# Patient Record
Sex: Male | Born: 1995 | Race: White | Hispanic: No | Marital: Single | Smoking: Never smoker
Health system: Southern US, Community
[De-identification: ages and names within clinical notes are randomized; demographics above are authoritative.]

---
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
## 2018-02-21 ENCOUNTER — Encounter: Payer: Self-pay | Admitting: Medical Oncology

## 2018-02-21 ENCOUNTER — Emergency Department: Payer: Managed Care, Other (non HMO)

## 2018-02-21 ENCOUNTER — Emergency Department
Admission: EM | Admit: 2018-02-21 | Discharge: 2018-02-21 | Disposition: A | Discharge status: Home / Self Care | Payer: Managed Care, Other (non HMO) | Attending: Emergency Medicine | Admitting: Emergency Medicine

## 2018-02-21 DIAGNOSIS — N200 Calculus of kidney: Secondary | ICD-10-CM | POA: Diagnosis not present

## 2018-02-21 DIAGNOSIS — R1031 Right lower quadrant pain: Secondary | ICD-10-CM | POA: Diagnosis present

## 2018-02-21 DIAGNOSIS — R110 Nausea: Secondary | ICD-10-CM | POA: Diagnosis not present

## 2018-02-21 LAB — URINALYSIS, COMPLETE (UACMP) WITH MICROSCOPIC
BACTERIA UA: NONE SEEN
Bilirubin Urine: NEGATIVE
Glucose, UA: NEGATIVE mg/dL
Ketones, ur: NEGATIVE mg/dL
Leukocytes, UA: NEGATIVE
Nitrite: NEGATIVE
PROTEIN: 30 mg/dL — AB
Specific Gravity, Urine: 1.02 (ref 1.005–1.030)
pH: 9 — ABNORMAL HIGH (ref 5.0–8.0)

## 2018-02-21 LAB — COMPREHENSIVE METABOLIC PANEL
ALK PHOS: 81 U/L (ref 38–126)
ALT: 89 U/L — ABNORMAL HIGH (ref 0–44)
ANION GAP: 12 (ref 5–15)
AST: 51 U/L — ABNORMAL HIGH (ref 15–41)
Albumin: 4.6 g/dL (ref 3.5–5.0)
BUN: 14 mg/dL (ref 6–20)
CO2: 25 mmol/L (ref 22–32)
Calcium: 9.5 mg/dL (ref 8.9–10.3)
Chloride: 102 mmol/L (ref 98–111)
Creatinine, Ser: 0.99 mg/dL (ref 0.61–1.24)
Glucose, Bld: 105 mg/dL — ABNORMAL HIGH (ref 70–99)
Potassium: 4.1 mmol/L (ref 3.5–5.1)
SODIUM: 139 mmol/L (ref 135–145)
Total Bilirubin: 0.9 mg/dL (ref 0.3–1.2)
Total Protein: 8.1 g/dL (ref 6.5–8.1)

## 2018-02-21 LAB — CBC
HEMATOCRIT: 50.2 % (ref 39.0–52.0)
Hemoglobin: 17.1 g/dL — ABNORMAL HIGH (ref 13.0–17.0)
MCH: 28.3 pg (ref 26.0–34.0)
MCHC: 34.1 g/dL (ref 30.0–36.0)
MCV: 83.1 fL (ref 80.0–100.0)
NRBC: 0 % (ref 0.0–0.2)
Platelets: 274 10*3/uL (ref 150–400)
RBC: 6.04 MIL/uL — ABNORMAL HIGH (ref 4.22–5.81)
RDW: 13.1 % (ref 11.5–15.5)
WBC: 6.8 10*3/uL (ref 4.0–10.5)

## 2018-02-21 LAB — LIPASE, BLOOD: LIPASE: 28 U/L (ref 11–51)

## 2018-02-21 MED ORDER — OXYCODONE-ACETAMINOPHEN 5-325 MG PO TABS: 1.0000 | ORAL_TABLET | ORAL | 0 refills | Status: AC | PRN

## 2018-02-21 MED ORDER — TAMSULOSIN HCL 0.4 MG PO CAPS: 0.4000 mg | ORAL_CAPSULE | Freq: Every day | ORAL | 0 refills | Status: AC

## 2018-02-21 MED ORDER — ONDANSETRON HCL 4 MG/2ML IJ SOLN
INTRAMUSCULAR | Status: AC
  Administered 2018-02-21: 4 mg via INTRAVENOUS
  Filled 2018-02-21: qty 2

## 2018-02-21 MED ORDER — IOPAMIDOL (ISOVUE-300) INJECTION 61%
30.0000 mL | Freq: Once | INTRAVENOUS | Status: AC | PRN
  Administered 2018-02-21: 30 mL via ORAL

## 2018-02-21 MED ORDER — ONDANSETRON HCL 4 MG/2ML IJ SOLN
4.0000 mg | Freq: Once | INTRAMUSCULAR | Status: AC
  Administered 2018-02-21: 4 mg via INTRAVENOUS

## 2018-02-21 MED ORDER — KETOROLAC TROMETHAMINE 60 MG/2ML IM SOLN
INTRAMUSCULAR | Status: AC
  Filled 2018-02-21: qty 2

## 2018-02-21 MED ORDER — SODIUM CHLORIDE 0.9 % IV BOLUS
1000.0000 mL | Freq: Once | INTRAVENOUS | Status: AC
  Administered 2018-02-21: 1000 mL via INTRAVENOUS

## 2018-02-21 MED ORDER — KETOROLAC TROMETHAMINE 60 MG/2ML IM SOLN
60.0000 mg | Freq: Once | INTRAMUSCULAR | Status: AC
  Administered 2018-02-21: 60 mg via INTRAMUSCULAR

## 2018-02-21 MED ORDER — MORPHINE SULFATE (PF) 4 MG/ML IV SOLN
INTRAVENOUS | Status: AC
  Administered 2018-02-21: 4 mg via INTRAVENOUS
  Filled 2018-02-21: qty 1

## 2018-02-21 MED ORDER — MORPHINE SULFATE (PF) 4 MG/ML IV SOLN
4.0000 mg | Freq: Once | INTRAVENOUS | Status: AC
  Administered 2018-02-21: 4 mg via INTRAVENOUS

## 2018-02-21 NOTE — ED Triage Notes (Signed)
Pt reports he began having rt lower abd pain this am while attempting to get out of his car, pt reports he has been having difficulty urinating also. Denies fever.

## 2018-02-21 NOTE — Discharge Instructions (Signed)
Please take your medication as needed, as prescribed.  If you failed to pass the kidney stone within the next 1 week or you have return of pain please call the number provided for urology to arrange a follow-up appointment.  Please use your urine strainer provided, to ensure passage of the stone.  Return to the emergency department for any painful urination, fever, or any other symptom personally concerning to yourself.

## 2018-02-21 NOTE — ED Provider Notes (Signed)
[facility] Emergency Department Provider Note  Time seen: 10:01 AM  I have reviewed the triage vital signs and the nursing notes.   HISTORY  Chief Complaint Abdominal Pain    HPI Tony Coleman is a 22 y.o. male with no significant past medical history presents to the emergency department for right lower quadrant abdominal pain beginning early this morning.  States it has been severe 10/10 pain sharp in the right lower quadrant.  Denies any dysuria although states it was somewhat difficult for him to urinate this morning, but states he feels like he fully emptied his bladder this morning.  Denies hematuria.  States nausea but denies vomiting.  Denies fever.   History reviewed. No pertinent past medical history.  There are no active problems to display for this patient.   History reviewed. No pertinent surgical history.  Prior to Admission medications   Not on File    No Known Allergies  No family history on file.  Social History Social History   Tobacco Use  . Smoking status: Not on file  Substance Use Topics  . Alcohol use: Not on file  . Drug use: Not on file    Review of Systems Constitutional: Negative for fever. Cardiovascular: Negative for chest pain. Respiratory: Negative for shortness of breath. Gastrointestinal: Right lower quadrant abdominal pain.  Positive for nausea.  Negative for vomiting. Genitourinary: No dysuria.  No hematuria. Musculoskeletal: Negative for musculoskeletal complaints Skin: Negative for skin complaints  Neurological: Negative for headache All other ROS negative  ____________________________________________   PHYSICAL EXAM:  VITAL SIGNS: ED Triage Vitals  Enc Vitals Group     BP 02/21/18 0940 138/75     Pulse Rate 02/21/18 0940 (!) 102     Resp 02/21/18 0940 18     Temp 02/21/18 0940 97.7 F (36.5 C)     Temp Source 02/21/18 0940 Oral     SpO2 02/21/18 0940 98 %     Weight 02/21/18 0940 250 lb  (113.4 kg)     Height 02/21/18 0940 6' (1.829 m)     Head Circumference --      Peak Flow --      Pain Score 02/21/18 0937 9     Pain Loc --      Pain Edu? --      Excl. in GC? --    Constitutional: Alert and oriented. Well appearing and in no distress. Eyes: Normal exam ENT   Head: Normocephalic and atraumatic.   Mouth/Throat: Mucous membranes are moist. Cardiovascular: Normal rate, regular rhythm. No murmur Respiratory: Normal respiratory effort without tachypnea nor retractions. Breath sounds are clear  Gastrointestinal: Soft, mild to moderate right lower quadrant tenderness otherwise benign abdominal exam without rebound guarding or distention. Musculoskeletal: Nontender with normal range of motion in all extremities. Neurologic:  Normal speech and language. No gross focal neurologic deficits  Skin:  Skin is warm, dry and intact.  Psychiatric: Mood and affect are normal.   ____________________________________________   RADIOLOGY  CT consistent with 2 to 3 mm distal right ureteral stone in the UVJ.  ____________________________________________   INITIAL IMPRESSION / ASSESSMENT AND PLAN / ED COURSE  Pertinent labs & imaging results that were available during my care of the patient were reviewed by me and considered in my medical decision making (see chart for details).  Patient presents emergency department with significant right lower quadrant abdominal pain beginning this morning.  Differential would include ureterolithiasis, appendicitis, less likely cholecystitis colitis  or diverticulitis.  We will check urine, labs, obtain CT imaging to further evaluate.  Patient agreeable to plan of care.  Labs are resulted showing RBCs and urinalysis again likely indicating ureterolithiasis which fits the clinical picture.  CT scan shows a 2 to 3 mm UVJ stone on the right side.  Patient states his pain is completely resolved after Toradol.  We will discharge with Flomax and pain  medication to use if needed.  We will discharge with a urine strainer and urology follow-up if needed.  Patient agreeable to plan of care.  Patient states his blood pressure is a little higher than normal currently 155/81, I recommended the patient follow-up with his doctor regarding this and will get a home blood pressure cuff to measure blood pressure at home when he is not under stress or anxiety.  Patient agreeable.    ____________________________________________   FINAL CLINICAL IMPRESSION(S) / ED DIAGNOSES  Right lower quadrant abdominal pain Ureterolithiasis   Minna Antis, MD 02/21/18 1107

## 2019-05-10 ENCOUNTER — Other Ambulatory Visit: Payer: Managed Care, Other (non HMO)

## 2019-05-11 ENCOUNTER — Other Ambulatory Visit: Payer: Self-pay

## 2019-05-11 ENCOUNTER — Encounter (HOSPITAL_COMMUNITY): Payer: Self-pay | Admitting: Emergency Medicine

## 2019-05-11 ENCOUNTER — Emergency Department (HOSPITAL_COMMUNITY)
Admission: EM | Admit: 2019-05-11 | Discharge: 2019-05-11 | Disposition: A | Discharge status: Home / Self Care | Payer: 59 | Attending: Emergency Medicine | Admitting: Emergency Medicine

## 2019-05-11 ENCOUNTER — Emergency Department (HOSPITAL_COMMUNITY): Payer: 59

## 2019-05-11 DIAGNOSIS — Z79899 Other long term (current) drug therapy: Secondary | ICD-10-CM | POA: Insufficient documentation

## 2019-05-11 DIAGNOSIS — R0602 Shortness of breath: Secondary | ICD-10-CM | POA: Diagnosis not present

## 2019-05-11 DIAGNOSIS — Z20822 Contact with and (suspected) exposure to covid-19: Secondary | ICD-10-CM | POA: Insufficient documentation

## 2019-05-11 LAB — POC SARS CORONAVIRUS 2 AG -  ED: SARS Coronavirus 2 Ag: NEGATIVE

## 2019-05-11 NOTE — ED Provider Notes (Signed)
[place] EMERGENCY DEPARTMENT Provider Note   CSN: [id number] Arrival date & time: 05/11/19  0308     History No chief complaint on file.   Tony Coleman is a 24 y.o. male.  Patient presents to the emergency department with a chief complaint of shortness of breath.  He reports recent exposures to Covid-19 at work.  He is concerned because he has had pneumonia twice.  He denies any fever or chills.  He states that he has had some nausea as well as diarrhea.  Denies any other associated symptoms.  There are no modifying factors.  The history is provided by the patient. No language interpreter was used.       No past medical history on file.  There are no problems to display for this patient.   No past surgical history on file.     No family history on file.  Social History   Tobacco Use  . Smoking status: Never Smoker  Substance Use Topics  . Alcohol use: Not on file  . Drug use: Not on file    Home Medications Prior to Admission medications   Medication Sig Start Date End Date Taking? Authorizing Provider  oxyCODONE-acetaminophen (PERCOCET) 5-325 MG tablet Take 1 tablet by mouth every 4 (four) hours as needed for severe pain. 02/21/18   Harvest Dark, MD  tamsulosin (FLOMAX) 0.4 MG CAPS capsule Take 1 capsule (0.4 mg total) by mouth daily. 02/21/18   Harvest Dark, MD    Allergies    Patient has no known allergies.  Review of Systems   Review of Systems  All other systems reviewed and are negative.   Physical Exam Updated Vital Signs BP (!) 143/93 (BP Location: Left Arm)   Pulse 88   Temp 98.3 F (36.8 C) (Oral)   Resp 17   Ht 6' (1.829 m)   Wt 122.5 kg   SpO2 97%   BMI 36.62 kg/m   Physical Exam Vitals and nursing note reviewed.  Constitutional:      Appearance: He is well-developed.  HENT:     Head: Normocephalic and atraumatic.  Eyes:     Conjunctiva/sclera: Conjunctivae normal.  Cardiovascular:     Rate and  Rhythm: Normal rate and regular rhythm.     Heart sounds: No murmur.  Pulmonary:     Effort: Pulmonary effort is normal. No respiratory distress.     Breath sounds: Normal breath sounds.  Abdominal:     Palpations: Abdomen is soft.     Tenderness: There is no abdominal tenderness.  Musculoskeletal:        General: Normal range of motion.     Cervical back: Neck supple.  Skin:    General: Skin is warm and dry.  Neurological:     Mental Status: He is alert and oriented to person, place, and time.  Psychiatric:        Mood and Affect: Mood normal.        Behavior: Behavior normal.     ED Results / Procedures / Treatments   Labs (all labs ordered are listed, but only abnormal results are displayed) Labs Reviewed  NOVEL CORONAVIRUS, NAA (HOSP ORDER, SEND-OUT TO REF LAB; TAT 18-24 HRS)  POC SARS CORONAVIRUS 2 AG -  ED    EKG None  Radiology DG Chest 2 View  Result Date: 05/11/2019 CLINICAL DATA:  Shortness of breath, unable to take a deep breath EXAM: CHEST - 2 VIEW COMPARISON:  None FINDINGS: No  consolidation, features of edema, pneumothorax, or effusion. Pulmonary vascularity is normally distributed. The cardiomediastinal contours are unremarkable. No acute osseous or soft tissue abnormality. IMPRESSION: No acute cardiopulmonary abnormality. Electronically Signed   By: Kreg Shropshire M.D.   On: 05/11/2019 03:46    Procedures Procedures (including critical care time)  Medications Ordered in ED Medications - No data to display  ED Course  I have reviewed the triage vital signs and the nursing notes.  Pertinent labs & imaging results that were available during my care of the patient were reviewed by me and considered in my medical decision making (see chart for details).    MDM Rules/Calculators/A&P                      Patient here with shortness of breath.  He is not hypoxic nor tachycardic.  Well's score is 0, PERC negative, doubt PE.   Recent covid exposures.  Will  check covid.  Patient very concerned about pneumonia due to hx of the same, will check CXR.  CXR negative.   Covid test pending.   Final Clinical Impression(s) / ED Diagnoses Final diagnoses:  SOB (shortness of breath)    Rx / DC Orders ED Discharge Orders    None       Roxy Horseman, PA-C 05/11/19 0445    Mesner, Barbara Cower, MD 05/11/19 [phone]

## 2019-05-11 NOTE — ED Notes (Signed)
Patient transported to X-Ray 

## 2019-05-11 NOTE — ED Triage Notes (Signed)
Pt here from work with c/o sob . Pt states that it feels like he cant take a deep breath , pt would like to get checked for PNA

## 2019-05-11 NOTE — Discharge Instructions (Signed)
Your rapid COVID test was negative.  This test is not very sensitive, so a more sensitive COVID test has been sent to the lab and should result in 24-48 hours.  You should quarantine until you have these results.   If your tests are positive, you will need to quarantine for 14 days, if they are negative you can return to work.  Your chest x-ray showed no evidence of pneumonia.    Please return to the ER for new or worsening symptoms.

## 2019-05-12 LAB — NOVEL CORONAVIRUS, NAA (HOSP ORDER, SEND-OUT TO REF LAB; TAT 18-24 HRS): SARS-CoV-2, NAA: NOT DETECTED

## 2019-06-04 ENCOUNTER — Ambulatory Visit: Payer: 59 | Attending: Internal Medicine

## 2019-06-04 DIAGNOSIS — Z23 Encounter for immunization: Secondary | ICD-10-CM | POA: Insufficient documentation

## 2019-06-04 NOTE — Progress Notes (Signed)
   Covid-19 Vaccination Clinic  Name:  Tony Coleman    MRN: [id number] DOB: 11/12/1995  06/04/2019  [name] was observed post Covid-19 immunization for 15 minutes without incidence. He was provided with Vaccine Information Sheet and instruction to access the V-Safe system.   [name] was instructed to call 911 with any severe reactions post vaccine: Marland Kitchen Difficulty breathing  . Swelling of your face and throat  . A fast heartbeat  . A bad rash all over your body  . Dizziness and weakness    Immunizations Administered    Name Date Dose VIS Date Route   Pfizer COVID-19 Vaccine 06/04/2019 11:29 AM 0.3 mL 03/26/2019 Intramuscular   Manufacturer: [employer], [employer]   Lot: [id number]   NDC: [address]-3

## 2019-06-29 ENCOUNTER — Ambulatory Visit: Payer: 59 | Attending: Internal Medicine

## 2019-06-29 DIAGNOSIS — Z23 Encounter for immunization: Secondary | ICD-10-CM

## 2019-06-29 NOTE — Progress Notes (Signed)
   Covid-19 Vaccination Clinic  Name:  Tony Coleman    MRN: [id number] DOB: March 17, 1996  06/29/2019  Tony Coleman was observed post Covid-19 immunization for 15 minutes without incident. He was provided with Vaccine Information Sheet and instruction to access the V-Safe system.   Tony Coleman was instructed to call 911 with any severe reactions post vaccine: Marland Kitchen Difficulty breathing  . Swelling of face and throat  . A fast heartbeat  . A bad rash all over body  . Dizziness and weakness   Immunizations Administered    Name Date Dose VIS Date Route   Pfizer COVID-19 Vaccine 06/29/2019 12:55 PM 0.3 mL 03/26/2019 Intramuscular   Manufacturer: [employer], [employer]   Lot: [id number]   NDC: [address]-1

## 2021-08-01 IMAGING — DX DG CHEST 2V
2 series · 2 of 2 positions shown · non-contrast
Comparison: None

CLINICAL DATA: Shortness of breath, unable to take a deep breath

EXAM:
CHEST - 2 VIEW

[chest pa]
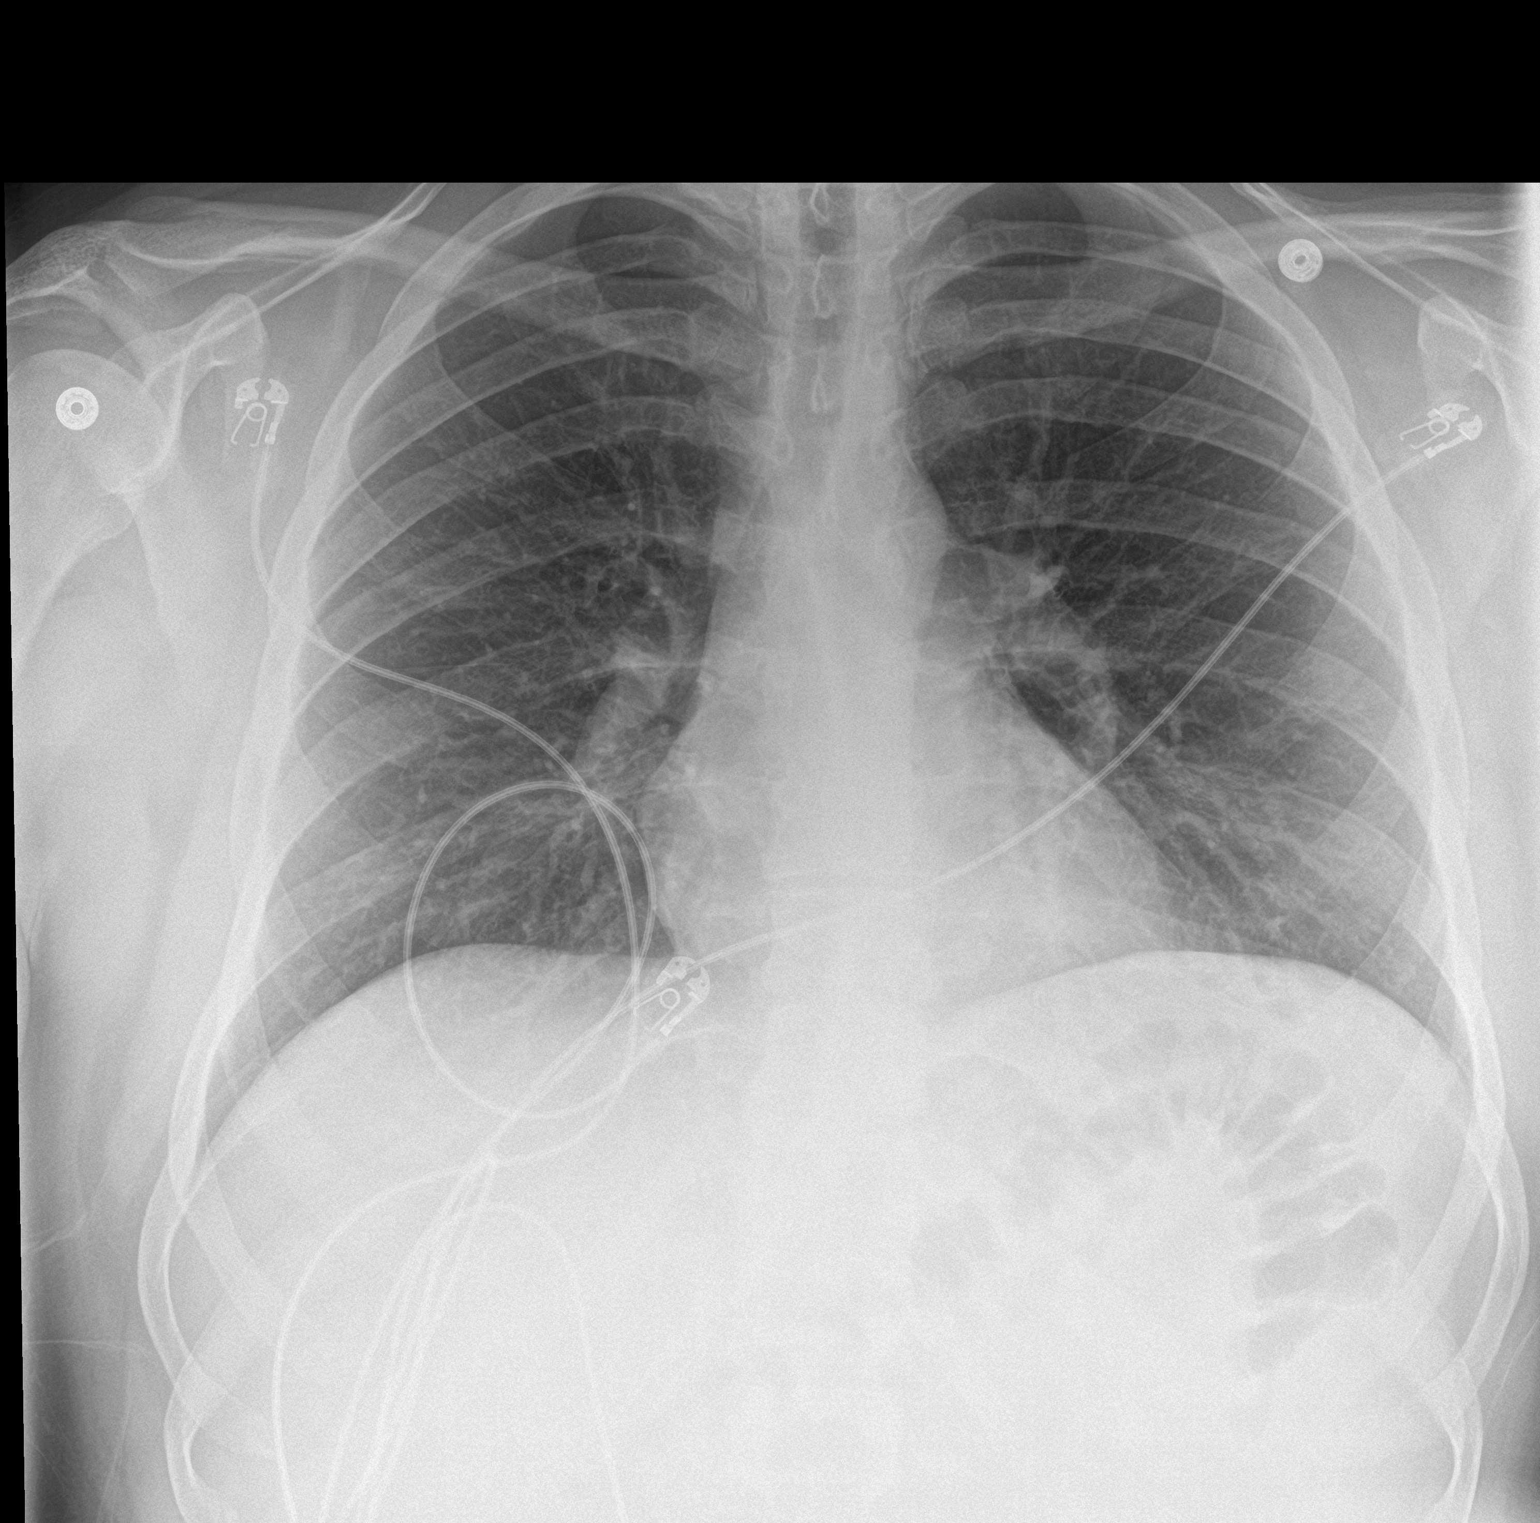

[chest lat]
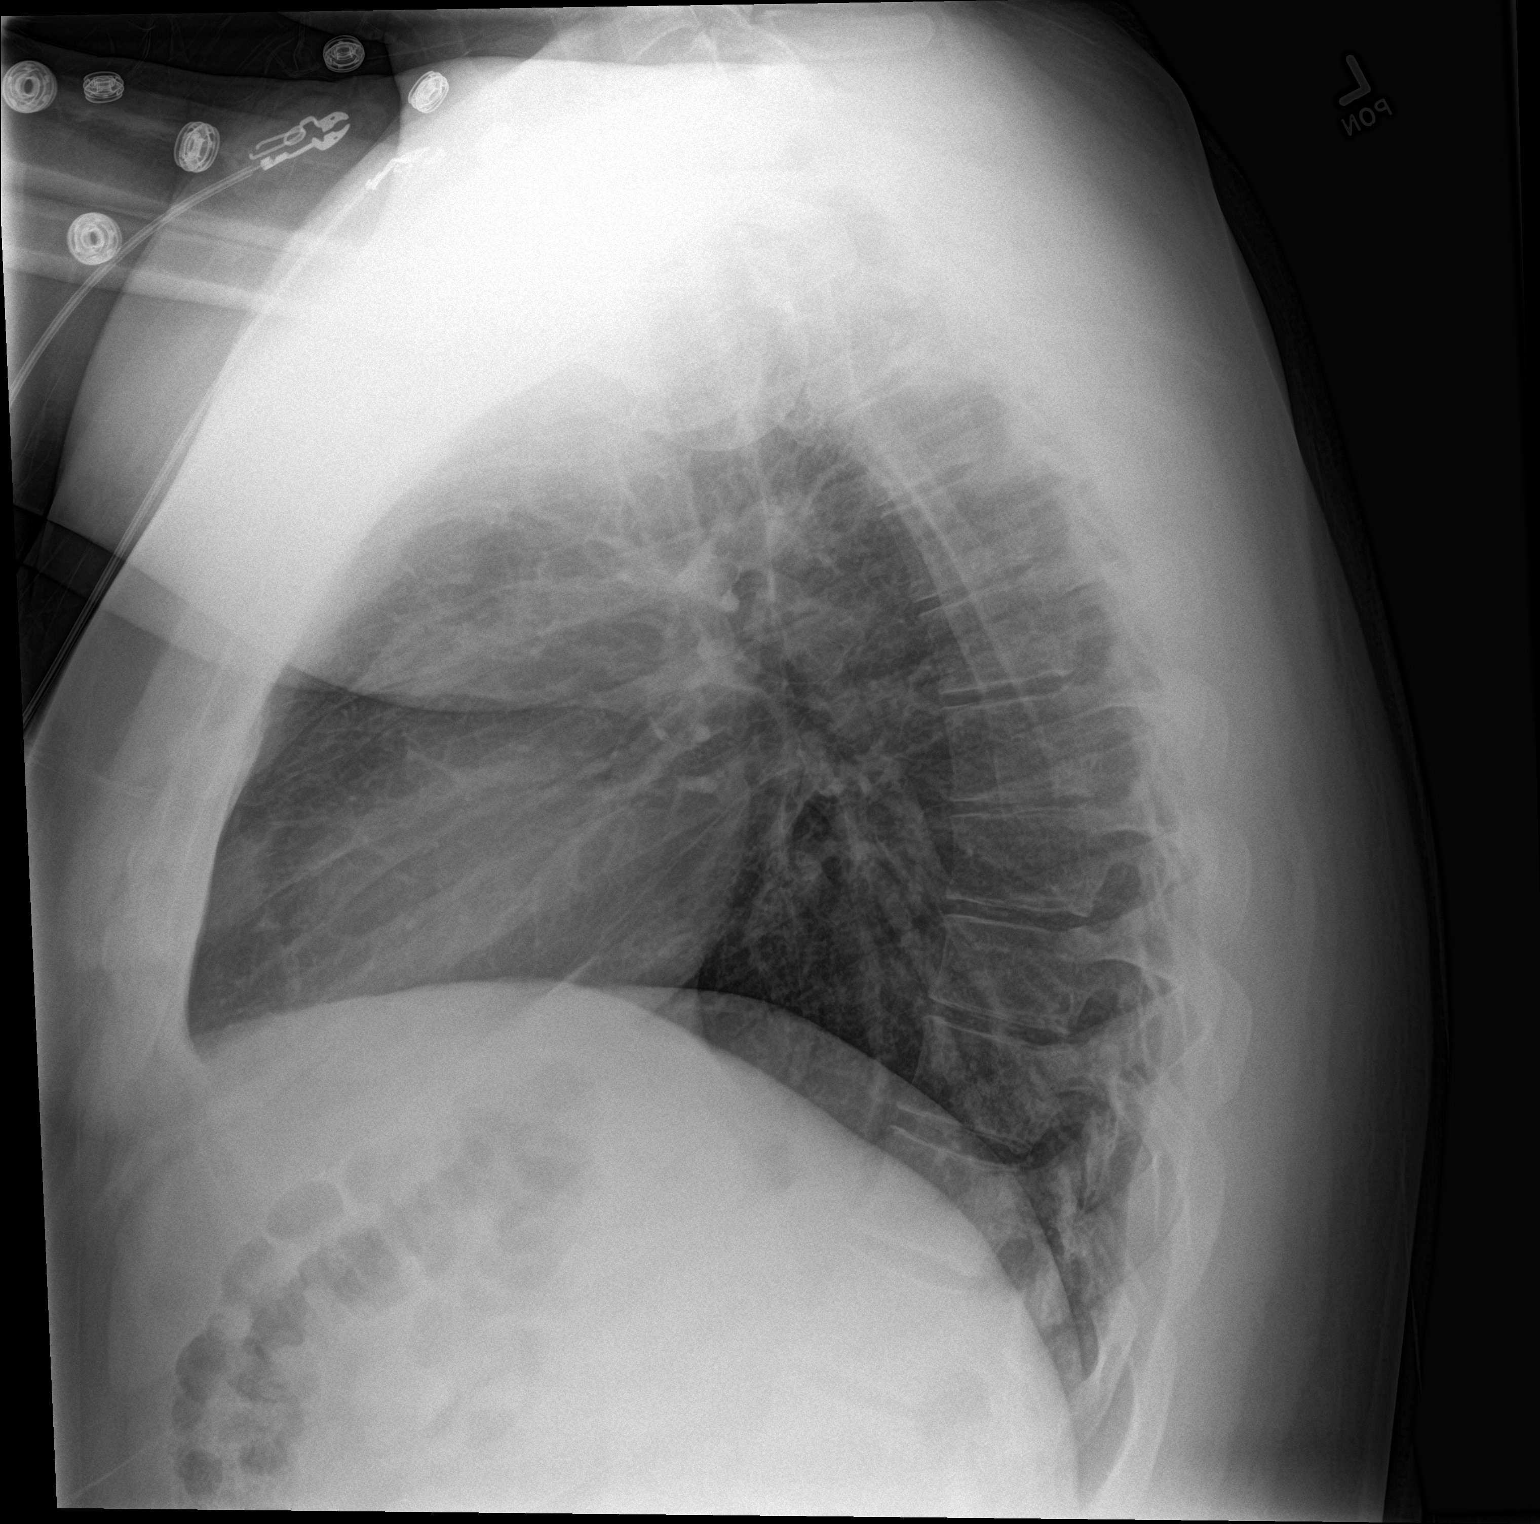

[2 of 2 positions shown; findings below may reference images not displayed]

FINDINGS: No consolidation, features of edema, pneumothorax, or effusion.
Pulmonary vascularity is normally distributed. The cardiomediastinal
contours are unremarkable. No acute osseous or soft tissue
abnormality.
IMPRESSION: No acute cardiopulmonary abnormality.
